# Patient Record
Sex: Female | Born: 1965 | Race: White | Hispanic: No | Marital: Married | Smoking: Never smoker
Health system: Southern US, Community
[De-identification: ages and names within clinical notes are randomized; demographics above are authoritative.]

## PROBLEM LIST (undated history)

## (undated) HISTORY — PX: NOSE SURGERY: SHX723

## (undated) HISTORY — PX: TUBAL LIGATION: SHX77

---

## 2008-05-07 DIAGNOSIS — C4491 Basal cell carcinoma of skin, unspecified: Secondary | ICD-10-CM

## 2008-05-07 DIAGNOSIS — D239 Other benign neoplasm of skin, unspecified: Secondary | ICD-10-CM

## 2008-05-07 HISTORY — DX: Other benign neoplasm of skin, unspecified: D23.9

## 2008-05-07 HISTORY — DX: Basal cell carcinoma of skin, unspecified: C44.91

## 2008-06-25 DIAGNOSIS — D239 Other benign neoplasm of skin, unspecified: Secondary | ICD-10-CM

## 2008-06-25 HISTORY — DX: Other benign neoplasm of skin, unspecified: D23.9

## 2008-10-06 ENCOUNTER — Encounter: Payer: Self-pay | Admitting: Maternal & Fetal Medicine

## 2009-03-27 ENCOUNTER — Ambulatory Visit: Payer: Self-pay | Admitting: Obstetrics and Gynecology

## 2009-03-30 ENCOUNTER — Inpatient Hospital Stay: Payer: Self-pay | Admitting: Obstetrics and Gynecology

## 2010-07-08 ENCOUNTER — Ambulatory Visit: Payer: Self-pay | Admitting: Family Medicine

## 2013-06-11 DIAGNOSIS — L570 Actinic keratosis: Secondary | ICD-10-CM

## 2013-06-11 HISTORY — DX: Actinic keratosis: L57.0

## 2018-01-16 DIAGNOSIS — M25561 Pain in right knee: Secondary | ICD-10-CM | POA: Diagnosis not present

## 2018-10-11 ENCOUNTER — Other Ambulatory Visit: Payer: Self-pay

## 2018-10-11 ENCOUNTER — Emergency Department: Payer: 59

## 2018-10-11 ENCOUNTER — Encounter: Payer: Self-pay | Admitting: Emergency Medicine

## 2018-10-11 ENCOUNTER — Emergency Department
Admission: EM | Admit: 2018-10-11 | Discharge: 2018-10-11 | Disposition: A | Discharge status: Home / Self Care | Payer: 59 | Attending: Emergency Medicine | Admitting: Emergency Medicine

## 2018-10-11 DIAGNOSIS — I4510 Unspecified right bundle-branch block: Secondary | ICD-10-CM | POA: Diagnosis not present

## 2018-10-11 DIAGNOSIS — R079 Chest pain, unspecified: Secondary | ICD-10-CM | POA: Diagnosis not present

## 2018-10-11 DIAGNOSIS — R0789 Other chest pain: Secondary | ICD-10-CM | POA: Diagnosis not present

## 2018-10-11 DIAGNOSIS — R55 Syncope and collapse: Secondary | ICD-10-CM | POA: Diagnosis not present

## 2018-10-11 LAB — BASIC METABOLIC PANEL
Anion gap: 8 (ref 5–15)
BUN: 12 mg/dL (ref 6–20)
CO2: 28 mmol/L (ref 22–32)
Calcium: 9.5 mg/dL (ref 8.9–10.3)
Chloride: 104 mmol/L (ref 98–111)
Creatinine, Ser: 0.88 mg/dL (ref 0.44–1.00)
GFR calc Af Amer: >60 mL/min (ref >60)
GFR calc non Af Amer: >60 mL/min (ref >60)
Glucose, Bld: 116 mg/dL — ABNORMAL HIGH (ref 70–99)
Potassium: 4.2 mmol/L (ref 3.5–5.1)
Sodium: 140 mmol/L (ref 135–145)

## 2018-10-11 LAB — CBC
HCT: 43 % (ref 36.0–46.0)
Hemoglobin: 13.9 g/dL (ref 12.0–15.0)
MCH: 29.6 pg (ref 26.0–34.0)
MCHC: 32.3 g/dL (ref 30.0–36.0)
MCV: 91.7 fL (ref 80.0–100.0)
Platelets: 248 10*3/uL (ref 150–400)
RBC: 4.69 MIL/uL (ref 3.87–5.11)
RDW: 12.4 % (ref 11.5–15.5)
WBC: 5.9 10*3/uL (ref 4.0–10.5)
nRBC: 0 % (ref 0.0–0.2)

## 2018-10-11 LAB — POCT PREGNANCY, URINE: Preg Test, Ur: NEGATIVE

## 2018-10-11 LAB — TROPONIN I
Troponin I: <0.03 ng/mL (ref <0.03)
Troponin I: <0.03 ng/mL (ref <0.03)

## 2018-10-11 NOTE — ED Notes (Signed)
Updated on wait. 

## 2018-10-11 NOTE — ED Provider Notes (Signed)
[facility] Emergency Department Provider Note ____________________________________________   First MD Initiated Contact with Patient 10/11/18 1530     (approximate)  I have reviewed the triage vital signs and the nursing notes.   HISTORY  Chief Complaint Chest Pain    HPI Selena Manning is a 52 y.o. female with PMH as noted below but no active medical problems and not currently on any medications who presents with atypical chest discomfort, acute onset this morning, described as tightness, and occurring while she was driving.  The patient states that she started to feel lightheaded and weak and had to pull over.  She was not exerting herself.  The symptoms subsided within an hour and she is now asymptomatic.  She reports having some of the tightness in the chest intermittently over the last few weeks, also nonexertional, but not the lightheadedness or other symptoms she had today.  The patient denies any shortness of breath, leg swelling, or other acute symptoms.  History reviewed. No pertinent past medical history.  There are no active problems to display for this patient.   Past Surgical History:  Procedure Laterality Date  . CESAREAN SECTION    . NOSE SURGERY    . TUBAL LIGATION      Prior to Admission medications   Not on File    Allergies Patient has no known allergies.  No family history on file.  Social History Social History   Tobacco Use  . Smoking status: Never Smoker  . Smokeless tobacco: Never Used  Substance Use Topics  . Alcohol use: Not on file  . Drug use: Not on file    Review of Systems  Constitutional: No fever. Eyes: No redness. ENT: No sore throat. Cardiovascular: Positive for resolved chest discomfort. Respiratory: Denies shortness of breath. Gastrointestinal: No vomiting or diarrhea.  Genitourinary: Negative for flank pain.  Musculoskeletal: Negative for back pain. Skin: Negative for rash. Neurological:  Negative for headache.   ____________________________________________   PHYSICAL EXAM:  VITAL SIGNS: ED Triage Vitals  Enc Vitals Group     BP 10/11/18 1200 (!) 150/72     Pulse Rate 10/11/18 1200 61     Resp 10/11/18 1200 16     Temp 10/11/18 1200 98.3 F (36.8 C)     Temp Source 10/11/18 1200 Oral     SpO2 10/11/18 1200 100 %     Weight 10/11/18 1200 150 lb (68 kg)     Height 10/11/18 1200 5\' 7"  (1.702 m)     Head Circumference --      Peak Flow --      Pain Score 10/11/18 1159 4     Pain Loc --      Pain Edu? --      Excl. in [place]? --     Constitutional: Alert and oriented. Well appearing and in no acute distress. Eyes: Conjunctivae are normal.  Head: Atraumatic. Nose: No congestion/rhinnorhea. Mouth/Throat: Mucous membranes are moist.   Neck: Normal range of motion.  Cardiovascular: Normal rate, regular rhythm. Grossly normal heart sounds.  Good peripheral circulation. Respiratory: Normal respiratory effort.  No retractions. Lungs CTAB. Gastrointestinal: No distention.  Musculoskeletal: Extremities warm and well perfused.  Neurologic:  Normal speech and language. No gross focal neurologic deficits are appreciated.  Skin:  Skin is warm and dry. No rash noted. Psychiatric: Mood and affect are normal. Speech and behavior are normal.  ____________________________________________   LABS (all labs ordered are listed, but only abnormal results are displayed)  Labs Reviewed  BASIC METABOLIC PANEL - Abnormal; Notable for the following components:      Result Value   Glucose, Bld 116 (*)    All other components within normal limits  CBC  TROPONIN I  TROPONIN I  POCT PREGNANCY, URINE  POC URINE PREG, ED   ____________________________________________  EKG  ED ECG REPORT I, Arta Silence, the attending physician, personally viewed and interpreted this ECG.  Date: 10/11/2018 EKG Time: 1200 Rate: 68 Rhythm: normal sinus rhythm QRS Axis: normal Intervals:  Incomplete RBBB ST/T Wave abnormalities: Nonspecific inferior and lateral T wave abnormality Narrative Interpretation: no evidence of acute ischemia; no prior EKG available for comparison  ____________________________________________  RADIOLOGY  CXR: No focal infiltrate or other acute abnormalities  ____________________________________________   PROCEDURES  Procedure(s) performed: No  Procedures  Critical Care performed: No ____________________________________________   INITIAL IMPRESSION / ASSESSMENT AND PLAN / ED COURSE  Pertinent labs & imaging results that were available during my care of the patient were reviewed by me and considered in my medical decision making (see chart for details).  52 year old female with no cardiac history, no active medical problems and not currently on any medications presents with atypical chest discomfort associated with lightheadedness this afternoon.  She has had a few episodes of mild chest discomfort over the last 2 weeks but without lightheadedness.  On exam the patient is very well-appearing.  She is currently asymptomatic.  Her vital signs are normal except for hypertension.  The remainder of her exam is unremarkable.  EKG shows no significant abnormalities (I do not have an old EKG for comparison).  Overall the presentation is most consistent with vasovagal near syncope.  The chest pain is likely musculoskeletal as I was somewhat able to reproduce it on exam.  The patient is low risk for ACS.  We will obtain a repeat troponin now that it has been more than 4 hours since the onset of the symptoms.  There is no clinical evidence for PE, aortic dissection, or other concerning acute cause given that the symptoms have resolved.  I anticipate discharge home if the repeat troponin is negative.  ----------------------------------------- 4:38 PM on 10/11/2018 -----------------------------------------  Repeat troponin is negative.  The patient  remains asymptomatic.  She is stable for discharge home at this time.  I counseled her on the results of the work-up.  Return precautions given, and she expresses understanding. ____________________________________________   FINAL CLINICAL IMPRESSION(S) / ED DIAGNOSES  Final diagnoses:  Atypical chest pain  Near syncope      NEW MEDICATIONS STARTED DURING THIS VISIT:  New Prescriptions   No medications on file     Note:  This document was prepared using Dragon voice recognition software and may include unintentional dictation errors.   Arta Silence, MD 10/11/18 [phone]

## 2018-10-11 NOTE — ED Notes (Signed)
Pt states she had CP this morning with L jaw/arm numbness. Currently no pain unless she lifts her arm.

## 2018-10-11 NOTE — Discharge Instructions (Addendum)
Return to the ER for new, worsening, or persistent severe chest pain or discomfort, weakness or lightheadedness, difficulty breathing, or any other new or worsening symptoms that concern you.  Follow-up with your primary care doctor within the next few weeks as discussed.

## 2018-10-11 NOTE — ED Notes (Signed)
Pt/husband educated on d/c instructions. IV removed. Ambulatory upon d/c.

## 2018-10-11 NOTE — ED Triage Notes (Signed)
C/O tightness to left chest, left arm pain, dizziness.  Onset of symptoms about 2 weeks ago.  Has scheduled an appointment with PCP scheduled.  Symptoms have been intermittent, but for past hour symptoms have been constant.

## 2018-11-12 DIAGNOSIS — R030 Elevated blood-pressure reading, without diagnosis of hypertension: Secondary | ICD-10-CM | POA: Diagnosis not present

## 2018-11-12 DIAGNOSIS — R635 Abnormal weight gain: Secondary | ICD-10-CM | POA: Diagnosis not present

## 2018-11-12 DIAGNOSIS — R739 Hyperglycemia, unspecified: Secondary | ICD-10-CM | POA: Diagnosis not present

## 2018-11-27 DIAGNOSIS — Z1211 Encounter for screening for malignant neoplasm of colon: Secondary | ICD-10-CM | POA: Diagnosis not present

## 2018-11-27 DIAGNOSIS — Z0000 Encounter for general adult medical examination without abnormal findings: Secondary | ICD-10-CM | POA: Diagnosis not present

## 2018-11-27 DIAGNOSIS — R739 Hyperglycemia, unspecified: Secondary | ICD-10-CM | POA: Diagnosis not present

## 2018-11-27 DIAGNOSIS — Z01818 Encounter for other preprocedural examination: Secondary | ICD-10-CM | POA: Diagnosis not present

## 2018-11-28 DIAGNOSIS — R8290 Unspecified abnormal findings in urine: Secondary | ICD-10-CM | POA: Diagnosis not present

## 2020-12-20 ENCOUNTER — Emergency Department (HOSPITAL_BASED_OUTPATIENT_CLINIC_OR_DEPARTMENT_OTHER): Payer: 59

## 2020-12-20 ENCOUNTER — Other Ambulatory Visit: Payer: Self-pay

## 2020-12-20 ENCOUNTER — Encounter (HOSPITAL_BASED_OUTPATIENT_CLINIC_OR_DEPARTMENT_OTHER): Payer: Self-pay | Admitting: Emergency Medicine

## 2020-12-20 ENCOUNTER — Emergency Department (HOSPITAL_BASED_OUTPATIENT_CLINIC_OR_DEPARTMENT_OTHER)
Admission: EM | Admit: 2020-12-20 | Discharge: 2020-12-20 | Disposition: A | Discharge status: Home / Self Care | Payer: 59 | Attending: Emergency Medicine | Admitting: Emergency Medicine

## 2020-12-20 DIAGNOSIS — W01198A Fall on same level from slipping, tripping and stumbling with subsequent striking against other object, initial encounter: Secondary | ICD-10-CM | POA: Insufficient documentation

## 2020-12-20 DIAGNOSIS — S59901A Unspecified injury of right elbow, initial encounter: Secondary | ICD-10-CM | POA: Diagnosis not present

## 2020-12-20 DIAGNOSIS — Y9389 Activity, other specified: Secondary | ICD-10-CM | POA: Insufficient documentation

## 2020-12-20 DIAGNOSIS — S0003XA Contusion of scalp, initial encounter: Secondary | ICD-10-CM | POA: Insufficient documentation

## 2020-12-20 DIAGNOSIS — S0990XA Unspecified injury of head, initial encounter: Secondary | ICD-10-CM | POA: Diagnosis present

## 2020-12-20 DIAGNOSIS — S53409A Unspecified sprain of unspecified elbow, initial encounter: Secondary | ICD-10-CM

## 2020-12-20 NOTE — ED Triage Notes (Signed)
Reports she was cleaning a light and when she went to step down she stepped down wrong causing her to fall hitting the back of her head.  Also c/o pain to right elbow.

## 2020-12-20 NOTE — Discharge Instructions (Signed)
Take tylenol, motrin for headaches   See your doctor for follow up   Your CT scan today didn't show any fractures or bleeding   Return to ER if you have worse headaches, neck pain, vomiting, weakness, numbness

## 2020-12-20 NOTE — ED Provider Notes (Signed)
[facility] Provider Note   CSN: [id number] Arrival date & time: 12/20/20  1756     History Chief Complaint  Patient presents with  . Head Injury    Selena Manning is a 55 y.o. female here with head injury, R elbow injury. Patient was cleaning a light and was about up on a 4 foot ladder. Patient slipped and fell backward and hit her head and right elbow. Denies syncope or LOC. No meds prior to arrival.   The history is provided by the patient.       History reviewed. No pertinent past medical history.  There are no problems to display for this patient.   Past Surgical History:  Procedure Laterality Date  . CESAREAN SECTION    . NOSE SURGERY    . TUBAL LIGATION       OB History   No obstetric history on file.     No family history on file.  Social History   Tobacco Use  . Smoking status: Never Smoker  . Smokeless tobacco: Never Used  Substance Use Topics  . Alcohol use: Yes  . Drug use: Never    Home Medications Prior to Admission medications   Not on File    Allergies    Patient has no known allergies.  Review of Systems   Review of Systems  Musculoskeletal:       R elbow pain   Neurological: Positive for headaches.  All other systems reviewed and are negative.   Physical Exam Updated Vital Signs BP (!) 117/95 (BP Location: Right Arm)   Pulse 61   Temp 98.3 F (36.8 C) (Oral)   Resp 20   Ht 5\' 7"  (1.702 m)   Wt 75.2 kg   SpO2 98%   BMI 25.97 kg/m   Physical Exam Vitals and nursing note reviewed.  Constitutional:      Appearance: Normal appearance.  HENT:     Head:     Comments: Small posterior scalp hematoma, no laceration     Nose: Nose normal.     Mouth/Throat:     Mouth: Mucous membranes are moist.  Eyes:     Extraocular Movements: Extraocular movements intact.     Pupils: Pupils are equal, round, and reactive to light.  Cardiovascular:     Rate and Rhythm: Normal rate and regular rhythm.      Pulses: Normal pulses.     Heart sounds: Normal heart sounds.  Pulmonary:     Effort: Pulmonary effort is normal.     Breath sounds: Normal breath sounds.  Abdominal:     General: Abdomen is flat.     Palpations: Abdomen is soft.  Musculoskeletal:     Cervical back: Normal range of motion and neck supple.     Comments: Mild R elbow tenderness, no obvious hematoma or skin tear. No midline spinal tenderness   Skin:    General: Skin is warm.     Capillary Refill: Capillary refill takes less than 2 seconds.  Neurological:     General: No focal deficit present.     Mental Status: She is alert and oriented to person, place, and time.     Cranial Nerves: No cranial nerve deficit.     Sensory: No sensory deficit.     Motor: No weakness.     Coordination: Coordination normal.  Psychiatric:        Mood and Affect: Mood normal.        Behavior: Behavior  normal.     ED Results / Procedures / Treatments   Labs (all labs ordered are listed, but only abnormal results are displayed) Labs Reviewed - No data to display  EKG None  Radiology DG Elbow Complete Right  Result Date: 12/20/2020 CLINICAL DATA:  Post fall with right elbow pain. EXAM: RIGHT ELBOW - COMPLETE 3+ VIEW COMPARISON:  None. FINDINGS: There is no evidence of fracture, dislocation, or joint effusion. There is no evidence of arthropathy or other focal bone abnormality. Soft tissues are unremarkable. IMPRESSION: Negative radiographs of the right elbow. Electronically Signed   By: Keith Rake M.D.   On: 12/20/2020 19:09   CT Head Wo Contrast  Result Date: 12/20/2020 CLINICAL DATA:  Head trauma, mod-severe fall Fall striking back of head. EXAM: CT HEAD WITHOUT CONTRAST TECHNIQUE: Contiguous axial images were obtained from the base of the skull through the vertex without intravenous contrast. COMPARISON:  None. FINDINGS: Brain: No intracranial hemorrhage, mass effect, or midline shift. No hydrocephalus. The basilar cisterns are  patent. Perivascular space versus remote lacunar infarct in the right basal ganglia. No evidence of territorial infarct or acute ischemia. No extra-axial or intracranial fluid collection. Vascular: No hyperdense vessel or unexpected calcification. Skull: No fracture or focal lesion. Sinuses/Orbits: Paranasal sinuses and mastoid air cells are clear. The visualized orbits are unremarkable. Other: Left parietal scalp hematoma. IMPRESSION: Left parietal scalp hematoma. No acute intracranial abnormality. No skull fracture. Electronically Signed   By: Keith Rake M.D.   On: 12/20/2020 19:08    Procedures Procedures   Medications Ordered in ED Medications - No data to display  ED Course  I have reviewed the triage vital signs and the nursing notes.  Pertinent labs & imaging results that were available during my care of the patient were reviewed by me and considered in my medical decision making (see chart for details).    MDM Rules/Calculators/A&P                         Gaylynn Seiple is a 55 y.o. female here with fall. Patient has posterior scalp hematoma. CT head unremarkable. Also right elbow injury but xrays unremarkable. Likely contusion. Not on blood thinners. Nonfocal neuro exam. Will dc home.    Final Clinical Impression(s) / ED Diagnoses Final diagnoses:  None    Rx / DC Orders ED Discharge Orders    None       Drenda Freeze, MD 12/20/20 2037

## 2021-01-05 ENCOUNTER — Other Ambulatory Visit (HOSPITAL_COMMUNITY): Payer: Self-pay | Admitting: Otolaryngology

## 2021-01-06 ENCOUNTER — Other Ambulatory Visit: Payer: Self-pay | Admitting: Family Medicine

## 2021-01-06 DIAGNOSIS — N950 Postmenopausal bleeding: Secondary | ICD-10-CM

## 2021-01-12 ENCOUNTER — Ambulatory Visit (HOSPITAL_COMMUNITY)
Admission: RE | Admit: 2021-01-12 | Discharge: 2021-01-12 | Disposition: A | Discharge status: Home / Self Care | Payer: 59 | Source: Ambulatory Visit | Attending: Family Medicine | Admitting: Family Medicine

## 2021-01-12 ENCOUNTER — Other Ambulatory Visit: Payer: Self-pay

## 2021-01-12 ENCOUNTER — Other Ambulatory Visit: Payer: Self-pay | Admitting: Family Medicine

## 2021-01-12 DIAGNOSIS — N950 Postmenopausal bleeding: Secondary | ICD-10-CM | POA: Insufficient documentation

## 2021-01-12 DIAGNOSIS — Z1231 Encounter for screening mammogram for malignant neoplasm of breast: Secondary | ICD-10-CM

## 2021-01-28 ENCOUNTER — Other Ambulatory Visit: Payer: Self-pay

## 2021-01-28 ENCOUNTER — Ambulatory Visit
Admission: RE | Admit: 2021-01-28 | Discharge: 2021-01-28 | Disposition: A | Discharge status: Home / Self Care | Payer: 59 | Source: Ambulatory Visit | Attending: Family Medicine | Admitting: Family Medicine

## 2021-01-28 DIAGNOSIS — Z1231 Encounter for screening mammogram for malignant neoplasm of breast: Secondary | ICD-10-CM | POA: Diagnosis not present

## 2021-10-21 ENCOUNTER — Encounter: Payer: Self-pay | Admitting: Dermatology

## 2021-10-25 ENCOUNTER — Ambulatory Visit: Payer: 59 | Admitting: Dermatology

## 2021-10-25 ENCOUNTER — Other Ambulatory Visit: Payer: Self-pay

## 2021-10-25 DIAGNOSIS — L570 Actinic keratosis: Secondary | ICD-10-CM

## 2021-10-25 DIAGNOSIS — D1800 Hemangioma unspecified site: Secondary | ICD-10-CM

## 2021-10-25 DIAGNOSIS — D229 Melanocytic nevi, unspecified: Secondary | ICD-10-CM

## 2021-10-25 DIAGNOSIS — Z86018 Personal history of other benign neoplasm: Secondary | ICD-10-CM

## 2021-10-25 DIAGNOSIS — L820 Inflamed seborrheic keratosis: Secondary | ICD-10-CM | POA: Diagnosis not present

## 2021-10-25 DIAGNOSIS — L578 Other skin changes due to chronic exposure to nonionizing radiation: Secondary | ICD-10-CM

## 2021-10-25 DIAGNOSIS — Z85828 Personal history of other malignant neoplasm of skin: Secondary | ICD-10-CM

## 2021-10-25 DIAGNOSIS — D485 Neoplasm of uncertain behavior of skin: Secondary | ICD-10-CM

## 2021-10-25 DIAGNOSIS — C4491 Basal cell carcinoma of skin, unspecified: Secondary | ICD-10-CM

## 2021-10-25 DIAGNOSIS — Z1283 Encounter for screening for malignant neoplasm of skin: Secondary | ICD-10-CM | POA: Diagnosis not present

## 2021-10-25 DIAGNOSIS — L821 Other seborrheic keratosis: Secondary | ICD-10-CM

## 2021-10-25 DIAGNOSIS — C44519 Basal cell carcinoma of skin of other part of trunk: Secondary | ICD-10-CM | POA: Diagnosis not present

## 2021-10-25 DIAGNOSIS — D489 Neoplasm of uncertain behavior, unspecified: Secondary | ICD-10-CM

## 2021-10-25 DIAGNOSIS — L814 Other melanin hyperpigmentation: Secondary | ICD-10-CM

## 2021-10-25 HISTORY — DX: Actinic keratosis: L57.0

## 2021-10-25 HISTORY — DX: Basal cell carcinoma of skin, unspecified: C44.91

## 2021-10-25 NOTE — Progress Notes (Signed)
New Patient Visit  Subjective  Selena Manning is a 56 y.o. 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 Patient reports most recent 2020. Patient does have several spots she would like checked back, chest, area at face would like checked. Patient noticed 5 months or more. ). The patient presents for Total-Body Skin Exam (TBSE) for skin cancer screening and mole check.  The patient has spots, moles and lesions to be evaluated, some may be new or changing and the patient has concerns that these could be cancer.  The following portions of the chart were reviewed this encounter and updated as appropriate:   Tobacco   Allergies   Meds   Problems   Med Hx   Surg Hx   Fam Hx      Review of Systems:  No other skin or systemic complaints except as noted in HPI or Assessment and Plan.  Objective  Well appearing patient in no apparent distress; mood and affect are within normal limits.  A full examination was performed including scalp, head, eyes, ears, nose, lips, neck, chest, axillae, abdomen, back, buttocks, bilateral upper extremities, bilateral lower extremities, hands, feet, fingers, toes, fingernails, and toenails. All findings within normal limits unless otherwise noted below.  right cheek x 2, forehead x 4 (6) Erythematous thin papules/macules with gritty scale.   right cheek x 2 (2) Erythematous stuck-on, waxy papule or plaque  left mid to lower back paraspinal 1 cm crusted pink papule        Left Breast 0.6 cm red papule         Assessment & Plan  Actinic keratosis (6) right cheek x 2, forehead x 4  Actinic keratoses are precancerous spots that appear secondary to cumulative UV radiation exposure/sun exposure over time. They are chronic with expected duration over 1 year. A portion of actinic keratoses will progress to squamous cell carcinoma of the skin. It is not possible  to reliably predict which spots will progress to skin cancer and so treatment is recommended to prevent development of skin cancer.  Recommend daily broad spectrum sunscreen SPF 30+ to sun-exposed areas, reapply every 2 hours as needed.  Recommend staying in the shade or wearing long sleeves, sun glasses (UVA+UVB protection) and wide brim hats (4-inch brim around the entire circumference of the hat). Call for new or changing lesions.  Destruction of lesion - right cheek x 2, forehead x 4 Complexity: simple   Destruction method: cryotherapy   Informed consent: discussed and consent obtained   Timeout:  patient name, date of birth, surgical site, and procedure verified Lesion destroyed using liquid nitrogen: Yes   Region frozen until ice ball extended beyond lesion: Yes   Outcome: patient tolerated procedure well with no complications   Post-procedure details: wound care instructions given   Additional details:  Prior to procedure, discussed risks of blister formation, small wound, skin dyspigmentation, or rare scar following cryotherapy. Recommend Vaseline ointment to treated areas while healing.   Inflamed seborrheic keratosis (2) right cheek x 2  Destruction of lesion - right cheek x 2 Complexity: simple   Destruction method: cryotherapy   Informed consent: discussed and consent obtained   Timeout:  patient name, date of birth, surgical site, and procedure verified Lesion destroyed using liquid nitrogen: Yes   Region frozen until ice ball extended beyond lesion: Yes   Outcome: patient tolerated procedure well with no complications  Post-procedure details: wound care instructions given   Additional details:  Prior to procedure, discussed risks of blister formation, small wound, skin dyspigmentation, or rare scar following cryotherapy. Recommend Vaseline ointment to treated areas while healing.   Neoplasm of uncertain behavior (2) left mid to lower back paraspinal  Epidermal /  dermal shaving  Lesion diameter (cm):  1 Informed consent: discussed and consent obtained   Timeout: patient name, date of birth, surgical site, and procedure verified   Procedure prep:  Patient was prepped and draped in usual sterile fashion Prep type:  Isopropyl alcohol Anesthesia: the lesion was anesthetized in a standard fashion   Anesthetic:  1% lidocaine w/ epinephrine 1-100,000 buffered w/ 8.4% NaHCO3 Instrument used: flexible razor blade   Hemostasis achieved with: pressure, aluminum chloride and electrodesiccation   Outcome: patient tolerated procedure well   Post-procedure details: sterile dressing applied and wound care instructions given   Dressing type: bandage and petrolatum    Destruction of lesion Complexity: extensive   Destruction method: electrodesiccation and curettage   Informed consent: discussed and consent obtained   Timeout:  patient name, date of birth, surgical site, and procedure verified Procedure prep:  Patient was prepped and draped in usual sterile fashion Prep type:  Isopropyl alcohol Anesthesia: the lesion was anesthetized in a standard fashion   Anesthetic:  1% lidocaine w/ epinephrine 1-100,000 buffered w/ 8.4% NaHCO3 Curettage performed in three different directions: Yes   Electrodesiccation performed over the curetted area: Yes   Curettage cycles:  4 Lesion length (cm):  1 Lesion width (cm):  1 Margin per side (cm):  0.2 Final wound size (cm):  1.4 Hemostasis achieved with:  pressure, aluminum chloride and electrodesiccation Outcome: patient tolerated procedure well with no complications   Post-procedure details: sterile dressing applied and wound care instructions given   Dressing type: bandage and petrolatum    Specimen 1 - Surgical pathology Differential Diagnosis: r/o inflamed seborrheic keratosis vs bcc  Check Margins: No  Left Breast  Skin / nail biopsy Type of biopsy: tangential   Informed consent: discussed and consent  obtained   Timeout: patient name, date of birth, surgical site, and procedure verified   Procedure prep:  Patient was prepped and draped in usual sterile fashion Prep type:  Isopropyl alcohol Anesthesia: the lesion was anesthetized in a standard fashion   Anesthetic:  1% lidocaine w/ epinephrine 1-100,000 buffered w/ 8.4% NaHCO3 Instrument used: flexible razor blade   Hemostasis achieved with: pressure, aluminum chloride and electrodesiccation   Outcome: patient tolerated procedure well   Post-procedure details: sterile dressing applied and wound care instructions given   Dressing type: petrolatum and bandage    Specimen 2 - Surgical pathology Differential Diagnosis: r/o bcc   Check Margins: No  Skin cancer screening  Lentigines - Scattered tan macules - Due to sun exposure - Benign-appearing, observe - Recommend daily broad spectrum sunscreen SPF 30+ to sun-exposed areas, reapply every 2 hours as needed. - Call for any changes  Seborrheic Keratoses - Stuck-on, waxy, tan-brown papules and/or plaques  - Benign-appearing - Discussed benign etiology and prognosis. - Observe - Call for any changes  Melanocytic Nevi - Tan-brown and/or pink-flesh-colored symmetric macules and papules - Benign appearing on exam today - Observation - Call clinic for new or changing moles - Recommend daily use of broad spectrum spf 30+ sunscreen to sun-exposed areas.   Hemangiomas - Red papules - Discussed benign nature - Observe - Call for any changes  Actinic Damage - Chronic condition,  secondary to cumulative UV/sun exposure - diffuse scaly erythematous macules with underlying dyspigmentation - Recommend daily broad spectrum sunscreen SPF 30+ to sun-exposed areas, reapply every 2 hours as needed.  - Staying in the shade or wearing long sleeves, sun glasses (UVA+UVB protection) and wide brim hats (4-inch brim around the entire circumference of the hat) are also recommended for sun  protection.  - Call for new or changing lesions.  Skin cancer screening performed today.  Return for 4 - 6 month tbse h/o bcc .  IRuthell Rummage, CMA, am acting as scribe for Sarina Ser, MD. Documentation: I have reviewed the above documentation for accuracy and completeness, and I agree with the above.  Sarina Ser, MD

## 2021-10-25 NOTE — Patient Instructions (Addendum)
Biopsy Wound Care Instructions  Leave the original bandage on for 24 hours if possible.  If the bandage becomes soaked or soiled before that time, it is OK to remove it and examine the wound.  A small amount of post-operative bleeding is normal.  If excessive bleeding occurs, remove the bandage, place gauze over the site and apply continuous pressure (no peeking) over the area for 30 minutes. If this does not work, please call our clinic as soon as possible or page your doctor if it is after hours.   Once a day, cleanse the wound with soap and water. It is fine to shower. If a thick crust develops you may use a Q-tip dipped into dilute hydrogen peroxide (mix 1:1 with water) to dissolve it.  Hydrogen peroxide can slow the healing process, so use it only as needed.    After washing, apply petroleum jelly (Vaseline) or an antibiotic ointment if your doctor prescribed one for you, followed by a bandage.    For best healing, the wound should be covered with a layer of ointment at all times. If you are not able to keep the area covered with a bandage to hold the ointment in place, this may mean re-applying the ointment several times a day.  Continue this wound care until the wound has healed and is no longer open.   Itching and mild discomfort is normal during the healing process. However, if you develop pain or severe itching, please call our office.   If you have any discomfort, you can take Tylenol (acetaminophen) or ibuprofen as directed on the bottle. (Please do not take these if you have an allergy to them or cannot take them for another reason).  Some redness, tenderness and white or yellow material in the wound is normal healing.  If the area becomes very sore and red, or develops a thick yellow-green material (pus), it may be infected; please notify us.    If you have stitches, return to clinic as directed to have the stitches removed. You will continue wound care for 2-3 days after the stitches  are removed.   Wound healing continues for up to one year following surgery. It is not unusual to experience pain in the scar from time to time during the interval.  If the pain becomes severe or the scar thickens, you should notify the office.    A slight amount of redness in a scar is expected for the first six months.  After six months, the redness will fade and the scar will soften and fade.  The color difference becomes less noticeable with time.  If there are any problems, return for a post-op surgery check at your earliest convenience.  To improve the appearance of the scar, you can use silicone scar gel, cream, or sheets (such as Mederma or Serica) every night for up to one year. These are available over the counter (without a prescription).  Please call our office at [phone] for any questions or concerns.   Electrodesiccation and Curettage (Scrape and Burn) Wound Care Instructions  Leave the original bandage on for 24 hours if possible.  If the bandage becomes soaked or soiled before that time, it is OK to remove it and examine the wound.  A small amount of post-operative bleeding is normal.  If excessive bleeding occurs, remove the bandage, place gauze over the site and apply continuous pressure (no peeking) over the area for 30 minutes. If this does not work, please call our  clinic as soon as possible or page your doctor if it is after hours.   Once a day, cleanse the wound with soap and water. It is fine to shower. If a thick crust develops you may use a Q-tip dipped into dilute hydrogen peroxide (mix 1:1 with water) to dissolve it.  Hydrogen peroxide can slow the healing process, so use it only as needed.    After washing, apply petroleum jelly (Vaseline) or an antibiotic ointment if your doctor prescribed one for you, followed by a bandage.    For best healing, the wound should be covered with a layer of ointment at all times. If you are not able to keep the area covered with  a bandage to hold the ointment in place, this may mean re-applying the ointment several times a day.  Continue this wound care until the wound has healed and is no longer open. It may take several weeks for the wound to heal and close.  Itching and mild discomfort is normal during the healing process.  If you have any discomfort, you can take Tylenol (acetaminophen) or ibuprofen as directed on the bottle. (Please do not take these if you have an allergy to them or cannot take them for another reason).  Some redness, tenderness and white or yellow material in the wound is normal healing.  If the area becomes very sore and red, or develops a thick yellow-green material (pus), it may be infected; please notify us.    Wound healing continues for up to one year following surgery. It is not unusual to experience pain in the scar from time to time during the interval.  If the pain becomes severe or the scar thickens, you should notify the office.    A slight amount of redness in a scar is expected for the first six months.  After six months, the redness will fade and the scar will soften and fade.  The color difference becomes less noticeable with time.  If there are any problems, return for a post-op surgery check at your earliest convenience.  To improve the appearance of the scar, you can use silicone scar gel, cream, or sheets (such as Mederma or Serica) every night for up to one year. These are available over the counter (without a prescription).  Please call our office at [phone] for any questions or concerns.    Melanoma ABCDEs  Melanoma is the most dangerous type of skin cancer, and is the leading cause of death from skin disease.  You are more likely to develop melanoma if you: Have light-colored skin, light-colored eyes, or red or blond hair Spend a lot of time in the sun Tan regularly, either outdoors or in a tanning bed Have had blistering sunburns, especially during  childhood Have a close family member who has had a melanoma Have atypical moles or large birthmarks  Early detection of melanoma is key since treatment is typically straightforward and cure rates are extremely high if we catch it early.   The first sign of melanoma is often a change in a mole or a new dark spot.  The ABCDE system is a way of remembering the signs of melanoma.  A for asymmetry:  The two halves do not match. B for border:  The edges of the growth are irregular. C for color:  A mixture of colors are present instead of an even brown color. D for diameter:  Melanomas are usually (but not always) greater than 8mm - the  size of a pencil eraser. E for evolution:  The spot keeps changing in size, shape, and color.  Please check your skin once per month between visits. You can use a small mirror in front and a large mirror behind you to keep an eye on the back side or your body.   If you see any new or changing lesions before your next follow-up, please call to schedule a visit.  Please continue daily skin protection including broad spectrum sunscreen SPF 30+ to sun-exposed areas, reapplying every 2 hours as needed when you're outdoors.   Staying in the shade or wearing long sleeves, sun glasses (UVA+UVB protection) and wide brim hats (4-inch brim around the entire circumference of the hat) are also recommended for sun protection.    If You Need Anything After Your Visit  If you have any questions or concerns for your doctor, please call our main line at [phone] and press option 4 to reach your doctor's medical assistant. If no one answers, please leave a voicemail as directed and we will return your call as soon as possible. Messages left after 4 pm will be answered the following business day.   You may also send [place] a message via [place]. We typically respond to MyChart messages within 1-2 business days.  For prescription refills, please ask your pharmacy to contact our  office. Our fax number is [phone].  If you have an urgent issue when the clinic is closed that cannot wait until the next business day, you can page your doctor at the number below.    Please note that while we do our best to be available for urgent issues outside of office hours, we are not available 24/7.   If you have an urgent issue and are unable to reach [place], you may choose to seek medical care at your doctor's office, retail clinic, urgent care center, or emergency room.  If you have a medical emergency, please immediately call 911 or go to the emergency department.  Pager Numbers  - Dr. Nehemiah Massed: [phone]  - Dr. Laurence Ferrari: [phone]  - Dr. Nicole Kindred: [phone]  In the event of inclement weather, please call our main line at [phone] for an update on the status of any delays or closures.  Dermatology Medication Tips: Please keep the boxes that topical medications come in in order to help keep track of the instructions about where and how to use these. Pharmacies typically print the medication instructions only on the boxes and not directly on the medication tubes.   If your medication is too expensive, please contact our office at [phone] option 4 or send [place] a message through [place].   We are unable to tell what your co-pay for medications will be in advance as this is different depending on your insurance coverage. However, we may be able to find a substitute medication at lower cost or fill out paperwork to get insurance to cover a needed medication.   If a prior authorization is required to get your medication covered by your insurance company, please allow [place] 1-2 business days to complete this process.  Drug prices often vary depending on where the prescription is filled and some pharmacies may offer cheaper prices.  The website www.goodrx.com contains coupons for medications through different pharmacies. The prices here do not account for what the cost may  be with help from insurance (it may be cheaper with your insurance), but the website can give you the price if you did not use  any insurance.  - You can print the associated coupon and take it with your prescription to the pharmacy.  - You may also stop by our office during regular business hours and pick up a GoodRx coupon card.  - If you need your prescription sent electronically to a different pharmacy, notify our office through [facility] or by phone at [phone] option 4.     Si Usted Necesita Algo Despus de Su Visita  Tambin puede enviarnos un mensaje a travs de Pharmacist, community. Por lo general respondemos a los mensajes de MyChart en el transcurso de 1 a 2 das hbiles.  Para renovar recetas, por favor pida a su farmacia que se ponga en contacto con nuestra oficina. Harland Dingwall de fax es [place] [phone].  Si tiene un asunto urgente cuando la clnica est cerrada y que no puede esperar hasta el siguiente da hbil, puede llamar/localizar a su doctor(a) al nmero que aparece a continuacin.   Por favor, tenga en cuenta que aunque hacemos todo lo posible para estar disponibles para asuntos urgentes fuera del horario de [place], no estamos disponibles las 24 horas del da, los 7 das de la [place].   Si tiene un problema urgente y no puede comunicarse con nosotros, puede optar por buscar atencin mdica  en el consultorio de su doctor(a), en una clnica privada, en un centro de atencin urgente o en una sala de emergencias.  Si tiene Engineering geologist, por favor llame inmediatamente al 911 o vaya a la sala de emergencias.  Nmeros de bper  - Dr. Nehemiah Massed: [phone]  - Dra. Moye: [phone]  - Dra. Nicole Kindred: [phone]  En caso de inclemencias del [place], por favor llame a Johnsie Kindred principal al [phone] para una actualizacin sobre el [place] de cualquier retraso o cierre.  Consejos para la medicacin en dermatologa: Por favor, guarde las cajas en las  que vienen los medicamentos de uso tpico para ayudarle a seguir las instrucciones sobre dnde y cmo usarlos. Las farmacias generalmente imprimen las instrucciones del medicamento slo en las cajas y no directamente en los tubos del [place].   Si su medicamento es muy caro, por favor, pngase en contacto con Zigmund Daniel llamando al [phone] y presione la opcin 4 o envenos un mensaje a travs de Pharmacist, community.   No podemos decirle cul ser su copago por los medicamentos por adelantado ya que esto es diferente dependiendo de la cobertura de su seguro. Sin embargo, es posible que podamos encontrar un medicamento sustituto a Electrical engineer un formulario para que el seguro cubra el medicamento que se considera necesario.   Si se requiere una autorizacin previa para que su compaa de seguros [place] su medicamento, por favor permtanos de 1 a 2 das hbiles para completar este proceso.  Los precios de los medicamentos varan con frecuencia dependiendo del Environmental consultant de dnde se surte la receta y alguna farmacias pueden ofrecer precios ms baratos.  El sitio web www.goodrx.com tiene cupones para medicamentos de Airline pilot. Los precios aqu no tienen en cuenta lo que podra costar con la ayuda del seguro (puede ser ms barato con su seguro), pero el sitio web puede darle el precio si no utiliz Research scientist (physical sciences).  - Puede imprimir el cupn correspondiente y llevarlo con su receta a la farmacia.  - Tambin puede pasar por nuestra oficina durante el horario de atencin regular y Charity fundraiser una tarjeta de cupones de GoodRx.  - Si necesita que su receta se enve electrnicamente a Ardelia Mems  farmacia diferente, informe a nuestra oficina a travs de MyChart de [place] o por telfono llamando al [phone] y presione la opcin 4.

## 2021-10-26 ENCOUNTER — Encounter: Payer: Self-pay | Admitting: Dermatology

## 2021-10-28 ENCOUNTER — Telehealth: Payer: Self-pay

## 2021-10-28 NOTE — Telephone Encounter (Signed)
-----   Message from Ralene Bathe, MD sent at 10/26/2021  6:34 PM EST ----- Diagnosis 1. 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 Skin , left breast HYPERTROPHIC ACTINIC KERATOSIS  1- Cancer - BCC Already treated Recheck next visit 2- PreCancer Schedule for treatment (LN2 and possible topical treatment)

## 2021-10-28 NOTE — Telephone Encounter (Signed)
Advised pt of bx results.  Scheduled pt for f/u to txt AK./sh

## 2021-12-02 ENCOUNTER — Other Ambulatory Visit: Payer: Self-pay

## 2021-12-02 ENCOUNTER — Ambulatory Visit: Payer: 59 | Admitting: Dermatology

## 2021-12-02 DIAGNOSIS — L570 Actinic keratosis: Secondary | ICD-10-CM | POA: Diagnosis not present

## 2021-12-02 DIAGNOSIS — L578 Other skin changes due to chronic exposure to nonionizing radiation: Secondary | ICD-10-CM | POA: Diagnosis not present

## 2021-12-02 DIAGNOSIS — Z85828 Personal history of other malignant neoplasm of skin: Secondary | ICD-10-CM | POA: Diagnosis not present

## 2021-12-02 NOTE — Progress Notes (Signed)
° °  Follow-Up Visit   Subjective  Selena Manning is a 56 y.o. female who presents for the following: Actinic Keratosis (Biopsy proven Hypertrophic AK on the left breast 10/25/2021 pt here for treatment ). BCC treated on the Left mid to lower back paraspinal 10/25/2021  The following portions of the chart were reviewed this encounter and updated as appropriate:   Tobacco   Allergies   Meds   Problems   Med Hx   Surg Hx   Fam Hx      Review of Systems:  No other skin or systemic complaints except as noted in HPI or Assessment and Plan.  Objective  Well appearing patient in no apparent distress; mood and affect are within normal limits.  A focused examination was performed including chest,back . Relevant physical exam findings are noted in the Assessment and Plan.  Chest - Medial [facility]) Erythematous thin papules/macules with gritty scale.    Assessment & Plan  AK (actinic keratosis) Chest - Medial [facility])  Biopsy proven AK  Actinic keratoses are precancerous spots that appear secondary to cumulative UV radiation exposure/sun exposure over time. They are chronic with expected duration over 1 year. A portion of actinic keratoses will progress to squamous cell carcinoma of the skin. It is not possible to reliably predict which spots will progress to skin cancer and so treatment is recommended to prevent development of skin cancer.  Recommend daily broad spectrum sunscreen SPF 30+ to sun-exposed areas, reapply every 2 hours as needed.  Recommend staying in the shade or wearing long sleeves, sun glasses (UVA+UVB protection) and wide brim hats (4-inch brim around the entire circumference of the hat). Call for new or changing lesions.   Destruction of lesion - Chest - Medial (Center) Complexity: simple   Destruction method: cryotherapy   Informed consent: discussed and consent obtained   Timeout:  patient name, date of birth, surgical site, and procedure verified Lesion destroyed using  liquid nitrogen: Yes   Region frozen until ice ball extended beyond lesion: Yes   Outcome: patient tolerated procedure well with no complications   Post-procedure details: wound care instructions given    History of Basal Cell Carcinoma of the Skin Left mid to lower back paraspinal 10/25/2021 - No evidence of recurrence today - Recommend regular full body skin exams - Recommend daily broad spectrum sunscreen SPF 30+ to sun-exposed areas, reapply every 2 hours as needed.  - Call if any new or changing lesions are noted between office visits   Actinic Damage - chronic, secondary to cumulative UV radiation exposure/sun exposure over time - diffuse scaly erythematous macules with underlying dyspigmentation - Recommend daily broad spectrum sunscreen SPF 30+ to sun-exposed areas, reapply every 2 hours as needed.  - Recommend staying in the shade or wearing long sleeves, sun glasses (UVA+UVB protection) and wide brim hats (4-inch brim around the entire circumference of the hat). - Call for new or changing lesions.  Return for as scheduled TBSE .  IMarye Round, CMA, am acting as scribe for Sarina Ser, MD .  Documentation: I have reviewed the above documentation for accuracy and completeness, and I agree with the above.  Sarina Ser, MD

## 2021-12-02 NOTE — Patient Instructions (Addendum)

## 2021-12-04 ENCOUNTER — Encounter: Payer: Self-pay | Admitting: Dermatology

## 2022-03-28 ENCOUNTER — Ambulatory Visit (INDEPENDENT_AMBULATORY_CARE_PROVIDER_SITE_OTHER): Payer: 59 | Admitting: Dermatology

## 2022-03-28 ENCOUNTER — Encounter: Payer: Self-pay | Admitting: Dermatology

## 2022-03-28 DIAGNOSIS — Z86018 Personal history of other benign neoplasm: Secondary | ICD-10-CM

## 2022-03-28 DIAGNOSIS — Z85828 Personal history of other malignant neoplasm of skin: Secondary | ICD-10-CM

## 2022-03-28 DIAGNOSIS — Z1283 Encounter for screening for malignant neoplasm of skin: Secondary | ICD-10-CM | POA: Diagnosis not present

## 2022-03-28 DIAGNOSIS — D492 Neoplasm of unspecified behavior of bone, soft tissue, and skin: Secondary | ICD-10-CM

## 2022-03-28 DIAGNOSIS — C44519 Basal cell carcinoma of skin of other part of trunk: Secondary | ICD-10-CM | POA: Diagnosis not present

## 2022-03-28 DIAGNOSIS — D229 Melanocytic nevi, unspecified: Secondary | ICD-10-CM

## 2022-03-28 DIAGNOSIS — L578 Other skin changes due to chronic exposure to nonionizing radiation: Secondary | ICD-10-CM | POA: Diagnosis not present

## 2022-03-28 DIAGNOSIS — L820 Inflamed seborrheic keratosis: Secondary | ICD-10-CM

## 2022-03-28 DIAGNOSIS — L814 Other melanin hyperpigmentation: Secondary | ICD-10-CM

## 2022-03-28 DIAGNOSIS — L821 Other seborrheic keratosis: Secondary | ICD-10-CM

## 2022-03-28 DIAGNOSIS — D1800 Hemangioma unspecified site: Secondary | ICD-10-CM

## 2022-03-28 NOTE — Patient Instructions (Signed)
Wound Care Instructions  Cleanse wound gently with soap and water once a day then pat dry with clean gauze. Apply a thing coat of Petrolatum (petroleum jelly, "Vaseline") over the wound (unless you have an allergy to this). We recommend that you use a new, sterile tube of Vaseline. Do not pick or remove scabs. Do not remove the yellow or white "healing tissue" from the base of the wound.  Cover the wound with fresh, clean, nonstick gauze and secure with paper tape. You may use Band-Aids in place of gauze and tape if the would is small enough, but would recommend trimming much of the tape off as there is often too much. Sometimes Band-Aids can irritate the skin.  You should call the office for your biopsy report after 1 week if you have not already been contacted.  If you experience any problems, such as abnormal amounts of bleeding, swelling, significant bruising, significant pain, or evidence of infection, please call the office immediately.  FOR ADULT SURGERY PATIENTS: If you need something for pain relief you may take 1 extra strength Tylenol (acetaminophen) AND 2 Ibuprofen ('200mg'$  each) together every 4 hours as needed for pain. (do not take these if you are allergic to them or if you have a reason you should not take them.) Typically, you may only need pain medication for 1 to 3 days.      Cryotherapy Aftercare  Wash gently with soap and water everyday.   Apply Vaseline and Band-Aid daily until healed.   Prior to procedure, discussed risks of blister formation, small wound, skin dyspigmentation, or rare scar following cryotherapy. Recommend Vaseline ointment to treated areas while healing.    Melanoma ABCDEs  Melanoma is the most dangerous type of skin cancer, and is the leading cause of death from skin disease.  You are more likely to develop melanoma if you: Have light-colored skin, light-colored eyes, or red or blond hair Spend a lot of time in the sun Tan regularly, either  outdoors or in a tanning bed Have had blistering sunburns, especially during childhood Have a close family member who has had a melanoma Have atypical moles or large birthmarks  Early detection of melanoma is key since treatment is typically straightforward and cure rates are extremely high if we catch it early.   The first sign of melanoma is often a change in a mole or a new dark spot.  The ABCDE system is a way of remembering the signs of melanoma.  A for asymmetry:  The two halves do not match. B for border:  The edges of the growth are irregular. C for color:  A mixture of colors are present instead of an even brown color. D for diameter:  Melanomas are usually (but not always) greater than 74m - the size of a pencil eraser. E for evolution:  The spot keeps changing in size, shape, and color.  Please check your skin once per month between visits. You can use a small mirror in front and a large mirror behind you to keep an eye on the back side or your body.   If you see any new or changing lesions before your next follow-up, please call to schedule a visit.  Please continue daily skin protection including broad spectrum sunscreen SPF 30+ to sun-exposed areas, reapplying every 2 hours as needed when you're outdoors.   Staying in the shade or wearing long sleeves, sun glasses (UVA+UVB protection) and wide brim hats (4-inch brim around the entire circumference  of the hat) are also recommended for sun protection.     Due to recent changes in healthcare laws, you may see results of your pathology and/or laboratory studies on MyChart before the doctors have had a chance to review them. We understand that in some cases there may be results that are confusing or concerning to you. Please understand that not all results are received at the same time and often the doctors may need to interpret multiple results in order to provide you with the best plan of care or course of treatment. Therefore, we  ask that you please give [place] 2 business days to thoroughly review all your results before contacting the office for clarification. Should we see a critical lab result, you will be contacted sooner.   If You Need Anything After Your Visit  If you have any questions or concerns for your doctor, please call our main line at [phone] and press option 4 to reach your doctor's medical assistant. If no one answers, please leave a voicemail as directed and we will return your call as soon as possible. Messages left after 4 pm will be answered the following business day.   You may also send [place] a message via [place]. We typically respond to MyChart messages within 1-2 business days.  For prescription refills, please ask your pharmacy to contact our office. Our fax number is [phone].  If you have an urgent issue when the clinic is closed that cannot wait until the next business day, you can page your doctor at the number below.    Please note that while we do our best to be available for urgent issues outside of office hours, we are not available 24/7.   If you have an urgent issue and are unable to reach [place], you may choose to seek medical care at your doctor's office, retail clinic, urgent care center, or emergency room.  If you have a medical emergency, please immediately call 911 or go to the emergency department.  Pager Numbers  - Dr. Nehemiah Massed: [phone]  - Dr. Laurence Ferrari: [phone]  - Dr. Nicole Kindred: [phone]  In the event of inclement weather, please call our main line at [phone] for an update on the status of any delays or closures.  Dermatology Medication Tips: Please keep the boxes that topical medications come in in order to help keep track of the instructions about where and how to use these. Pharmacies typically print the medication instructions only on the boxes and not directly on the medication tubes.   If your medication is too expensive, please contact our office  at [phone] option 4 or send [place] a message through [place].   We are unable to tell what your co-pay for medications will be in advance as this is different depending on your insurance coverage. However, we may be able to find a substitute medication at lower cost or fill out paperwork to get insurance to cover a needed medication.   If a prior authorization is required to get your medication covered by your insurance company, please allow [place] 1-2 business days to complete this process.  Drug prices often vary depending on where the prescription is filled and some pharmacies may offer cheaper prices.  The website www.goodrx.com contains coupons for medications through different pharmacies. The prices here do not account for what the cost may be with help from insurance (it may be cheaper with your insurance), but the website can give you the price if you did not use  any insurance.  - You can print the associated coupon and take it with your prescription to the pharmacy.  - You may also stop by our office during regular business hours and pick up a GoodRx coupon card.  - If you need your prescription sent electronically to a different pharmacy, notify our office through [facility] or by phone at [phone] option 4.     Si Usted Necesita Algo Despus de Su Visita  Tambin puede enviarnos un mensaje a travs de Pharmacist, community. Por lo general respondemos a los mensajes de MyChart en el transcurso de 1 a 2 das hbiles.  Para renovar recetas, por favor pida a su farmacia que se ponga en contacto con nuestra oficina. Harland Dingwall de fax es [place] [phone].  Si tiene un asunto urgente cuando la clnica est cerrada y que no puede esperar hasta el siguiente da hbil, puede llamar/localizar a su doctor(a) al nmero que aparece a continuacin.   Por favor, tenga en cuenta que aunque hacemos todo lo posible para estar disponibles para asuntos urgentes fuera del horario de [place], no estamos  disponibles las 24 horas del da, los 7 das de la [place].   Si tiene un problema urgente y no puede comunicarse con nosotros, puede optar por buscar atencin mdica  en el consultorio de su doctor(a), en una clnica privada, en un centro de atencin urgente o en una sala de emergencias.  Si tiene Engineering geologist, por favor llame inmediatamente al 911 o vaya a la sala de emergencias.  Nmeros de bper  - Dr. Nehemiah Massed: [phone]  - Dra. Moye: [phone]  - Dra. Nicole Kindred: [phone]  En caso de inclemencias del [place], por favor llame a Johnsie Kindred principal al [phone] para una actualizacin sobre el [place] de cualquier retraso o cierre.  Consejos para la medicacin en dermatologa: Por favor, guarde las cajas en las que vienen los medicamentos de uso tpico para ayudarle a seguir las instrucciones sobre dnde y cmo usarlos. Las farmacias generalmente imprimen las instrucciones del medicamento slo en las cajas y no directamente en los tubos del [place].   Si su medicamento es muy caro, por favor, pngase en contacto con Zigmund Daniel llamando al [phone] y presione la opcin 4 o envenos un mensaje a travs de Pharmacist, community.   No podemos decirle cul ser su copago por los medicamentos por adelantado ya que esto es diferente dependiendo de la cobertura de su seguro. Sin embargo, es posible que podamos encontrar un medicamento sustituto a Electrical engineer un formulario para que el seguro cubra el medicamento que se considera necesario.   Si se requiere una autorizacin previa para que su compaa de seguros [place] su medicamento, por favor permtanos de 1 a 2 das hbiles para completar este proceso.  Los precios de los medicamentos varan con frecuencia dependiendo del Environmental consultant de dnde se surte la receta y alguna farmacias pueden ofrecer precios ms baratos.  El sitio web www.goodrx.com tiene cupones para medicamentos de Airline pilot. Los precios aqu no  tienen en cuenta lo que podra costar con la ayuda del seguro (puede ser ms barato con su seguro), pero el sitio web puede darle el precio si no utiliz Research scientist (physical sciences).  - Puede imprimir el cupn correspondiente y llevarlo con su receta a la farmacia.  - Tambin puede pasar por nuestra oficina durante el horario de atencin regular y Charity fundraiser una tarjeta de cupones de GoodRx.  - Si necesita que su receta se enve electrnicamente a  una farmacia diferente, informe a nuestra oficina a travs de MyChart de [place] o por telfono llamando al [phone] y presione la opcin 4.

## 2022-03-28 NOTE — Progress Notes (Signed)
Follow-Up Visit   Subjective  Selena Manning is a 56 y.o. female who presents for the following: Annual Exam (Skin cancer screening. Full body. Hx of BCC's. Hx of dysplastic nevi, Hx of Aks. Area on nose was scaly). The patient presents for Total-Body Skin Exam (TBSE) for skin cancer screening and mole check.  The patient has spots, moles and lesions to be evaluated, some may be new or changing and the patient has concerns that these could be cancer.  The following portions of the chart were reviewed this encounter and updated as appropriate:  Tobacco  Allergies  Meds  Problems  Med Hx  Surg Hx  Fam Hx     Review of Systems: No other skin or systemic complaints except as noted in HPI or Assessment and Plan.  Objective  Well appearing patient in no apparent distress; mood and affect are within normal limits.  A full examination was performed including scalp, head, eyes, ears, nose, lips, neck, chest, axillae, abdomen, back, buttocks, bilateral upper extremities, bilateral lower extremities, hands, feet, fingers, toes, fingernails, and toenails. All findings within normal limits unless otherwise noted below.  Right inferior medial scapular area 1.0 cm pink scaly patch     left medial inframammary x2 (2) Erythematous keratotic or waxy stuck-on papule or plaque.   Assessment & Plan   History of Basal Cell Carcinoma of the Skin. Multiple sites, see history. - No evidence of recurrence today - Recommend regular full body skin exams - Recommend daily broad spectrum sunscreen SPF 30+ to sun-exposed areas, reapply every 2 hours as needed.  - Call if any new or changing lesions are noted between office visits   History of Dysplastic Nevi. Multiple sites, see history. - No evidence of recurrence today - Recommend regular full body skin exams - Recommend daily broad spectrum sunscreen SPF 30+ to sun-exposed areas, reapply every 2 hours as needed.  - Call if any new or changing  lesions are noted between office visits   Lentigines - Scattered tan macules - Due to sun exposure - Benign-appearing, observe - Recommend daily broad spectrum sunscreen SPF 30+ to sun-exposed areas, reapply every 2 hours as needed. - Call for any changes  Seborrheic Keratoses - Stuck-on, waxy, tan-brown papules and/or plaques  - Benign-appearing - Discussed benign etiology and prognosis. - Observe - Call for any changes  Melanocytic Nevi - Tan-brown and/or pink-flesh-colored symmetric macules and papules - Benign appearing on exam today - Observation - Call clinic for new or changing moles - Recommend daily use of broad spectrum spf 30+ sunscreen to sun-exposed areas.   Hemangiomas - Red papules - Discussed benign nature - Observe - Call for any changes  Actinic Damage - Chronic condition, secondary to cumulative UV/sun exposure - diffuse scaly erythematous macules with underlying dyspigmentation - Recommend daily broad spectrum sunscreen SPF 30+ to sun-exposed areas, reapply every 2 hours as needed.  - Staying in the shade or wearing long sleeves, sun glasses (UVA+UVB protection) and wide brim hats (4-inch brim around the entire circumference of the hat) are also recommended for sun protection.  - Call for new or changing lesions.  Skin cancer screening performed today.  Neoplasm of skin Right inferior medial scapular area Epidermal / dermal shaving  Lesion diameter (cm):  1 Informed consent: discussed and consent obtained   Timeout: patient name, date of birth, surgical site, and procedure verified   Procedure prep:  Patient was prepped and draped in usual sterile fashion Prep type:  Isopropyl  alcohol Anesthesia: the lesion was anesthetized in a standard fashion   Anesthetic:  1% lidocaine w/ epinephrine 1-100,000 buffered w/ 8.4% NaHCO3 Instrument used: flexible razor blade   Hemostasis achieved with: pressure, aluminum chloride and electrodesiccation   Outcome:  patient tolerated procedure well   Post-procedure details: sterile dressing applied and wound care instructions given   Dressing type: bandage and petrolatum    Destruction of lesion Complexity: extensive   Destruction method: electrodesiccation and curettage   Informed consent: discussed and consent obtained   Timeout:  patient name, date of birth, surgical site, and procedure verified Procedure prep:  Patient was prepped and draped in usual sterile fashion Prep type:  Isopropyl alcohol Anesthesia: the lesion was anesthetized in a standard fashion   Anesthetic:  1% lidocaine w/ epinephrine 1-100,000 buffered w/ 8.4% NaHCO3 Curettage performed in three different directions: Yes   Electrodesiccation performed over the curetted area: Yes   Curettage cycles:  3 Lesion length (cm):  1 Lesion width (cm):  1 Margin per side (cm):  0.2 Final wound size (cm):  1.4 Hemostasis achieved with:  pressure and aluminum chloride Outcome: patient tolerated procedure well with no complications   Post-procedure details: sterile dressing applied and wound care instructions given   Dressing type: bandage and petrolatum    Specimen 1 - Surgical pathology Differential Diagnosis: R/O BCC Check Margins: No  Inflamed seborrheic keratosis (2) left medial inframammary x2 Symptomatic, irritating, patient would like treated.  Destruction of lesion - left medial inframammary x2 Complexity: simple   Destruction method: cryotherapy   Informed consent: discussed and consent obtained   Timeout:  patient name, date of birth, surgical site, and procedure verified Lesion destroyed using liquid nitrogen: Yes   Region frozen until ice ball extended beyond lesion: Yes   Outcome: patient tolerated procedure well with no complications   Post-procedure details: wound care instructions given   Additional details:  Prior to procedure, discussed risks of blister formation, small wound, skin dyspigmentation, or rare scar  following cryotherapy. Recommend Vaseline ointment to treated areas while healing.  Skin cancer screening  Return in about 6 months (around 09/27/2022) for TBSE, Hx of multiple BCC's.  I, Emelia Salisbury, CMA, am acting as scribe for Sarina Ser, MD. Documentation: I have reviewed the above documentation for accuracy and completeness, and I agree with the above.  Sarina Ser, MD

## 2022-03-30 ENCOUNTER — Telehealth: Payer: Self-pay

## 2022-03-30 NOTE — Telephone Encounter (Signed)
Discussed biopsy results with pt  °

## 2022-03-30 NOTE — Telephone Encounter (Signed)
-----   Message from Ralene Bathe, MD sent at 03/29/2022  4:13 PM EDT ----- Diagnosis Skin , right inferior medial scapular area BASAL CELL CARCINOMA, SUPERFICIAL AND NODULAR PATTERNS, CLOSE TO MARGIN  Cancer - BCC Superficial Already treated Recheck next visit

## 2022-04-26 IMAGING — CR DG ELBOW COMPLETE 3+V*R*
4 series · 4 of 4 positions shown · non-contrast
Comparison: None.

CLINICAL DATA: Post fall with right elbow pain.

EXAM:
RIGHT ELBOW - COMPLETE 3+ VIEW

[x elbow joint ap right]
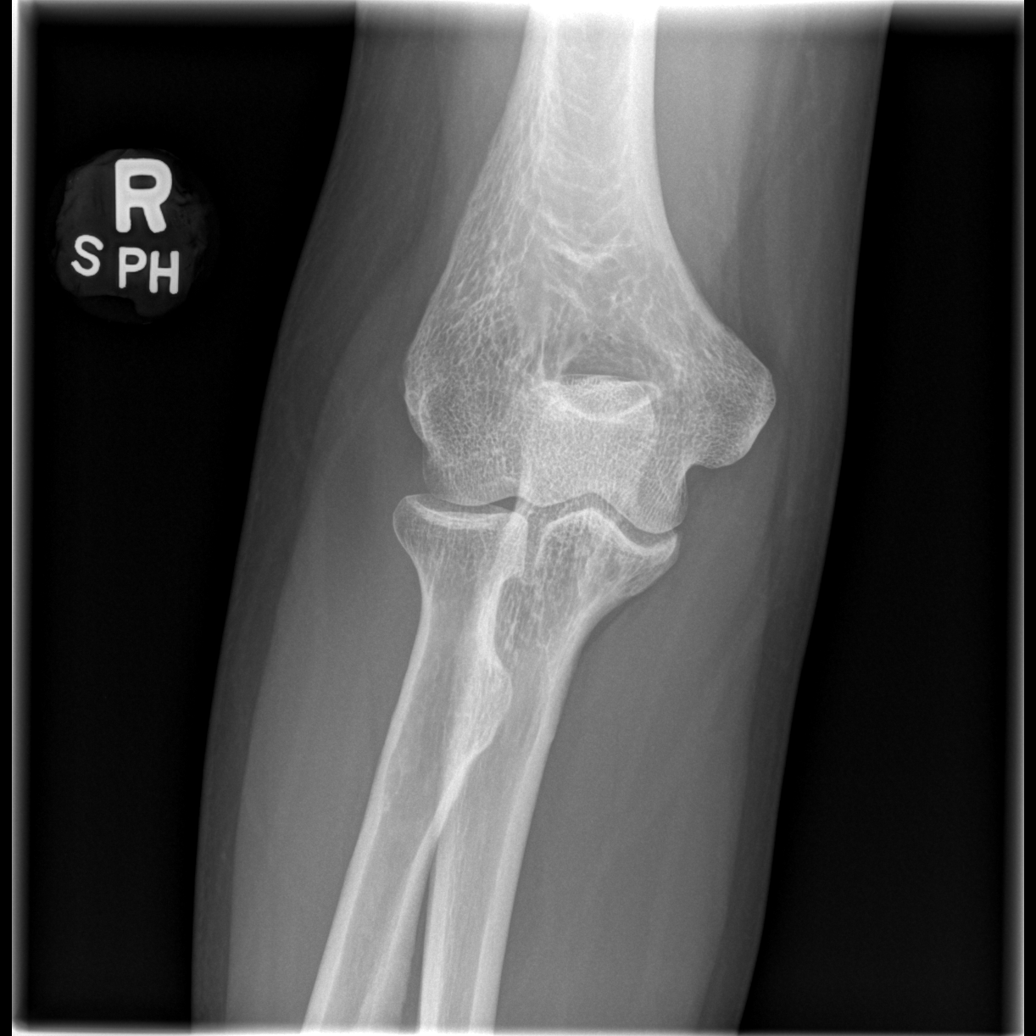

[x elbow joint obl. right (1 of 2)]
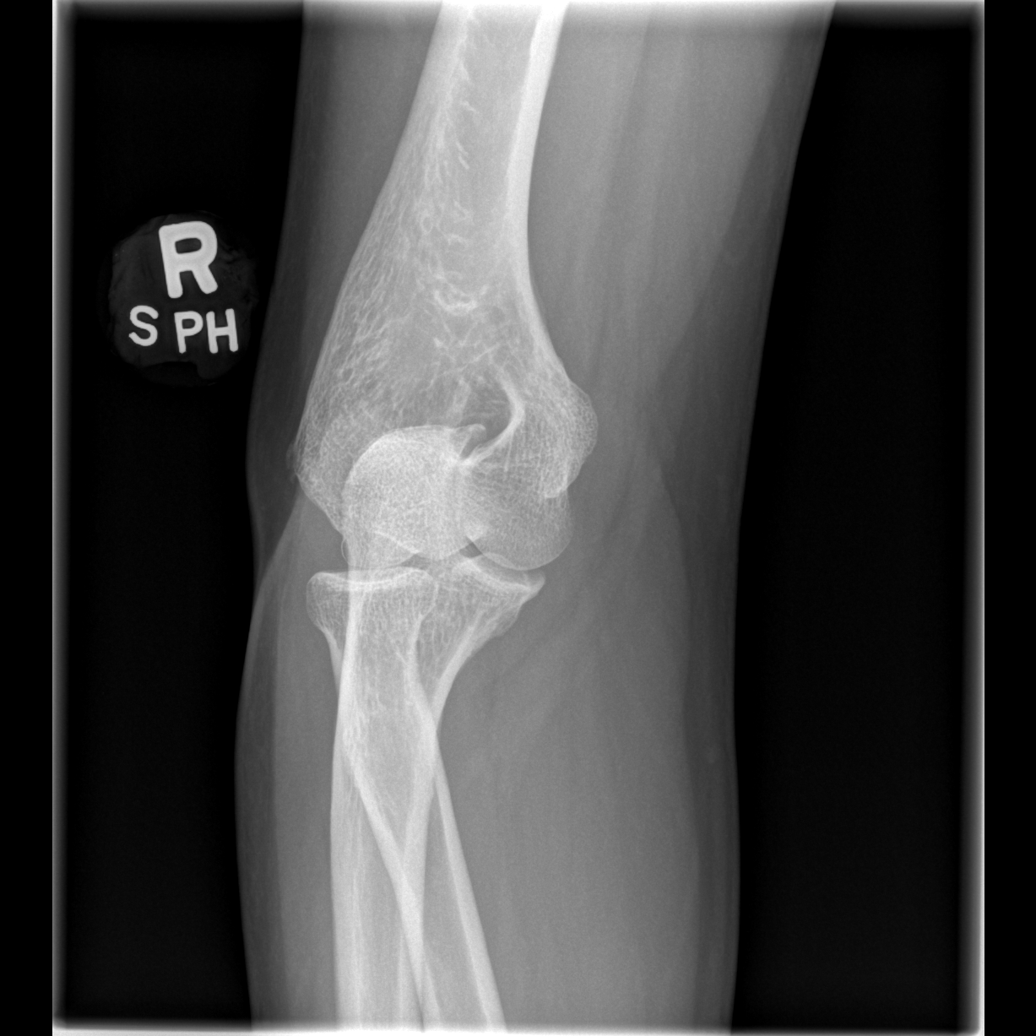

[x elbow joint obl. right (2 of 2)]
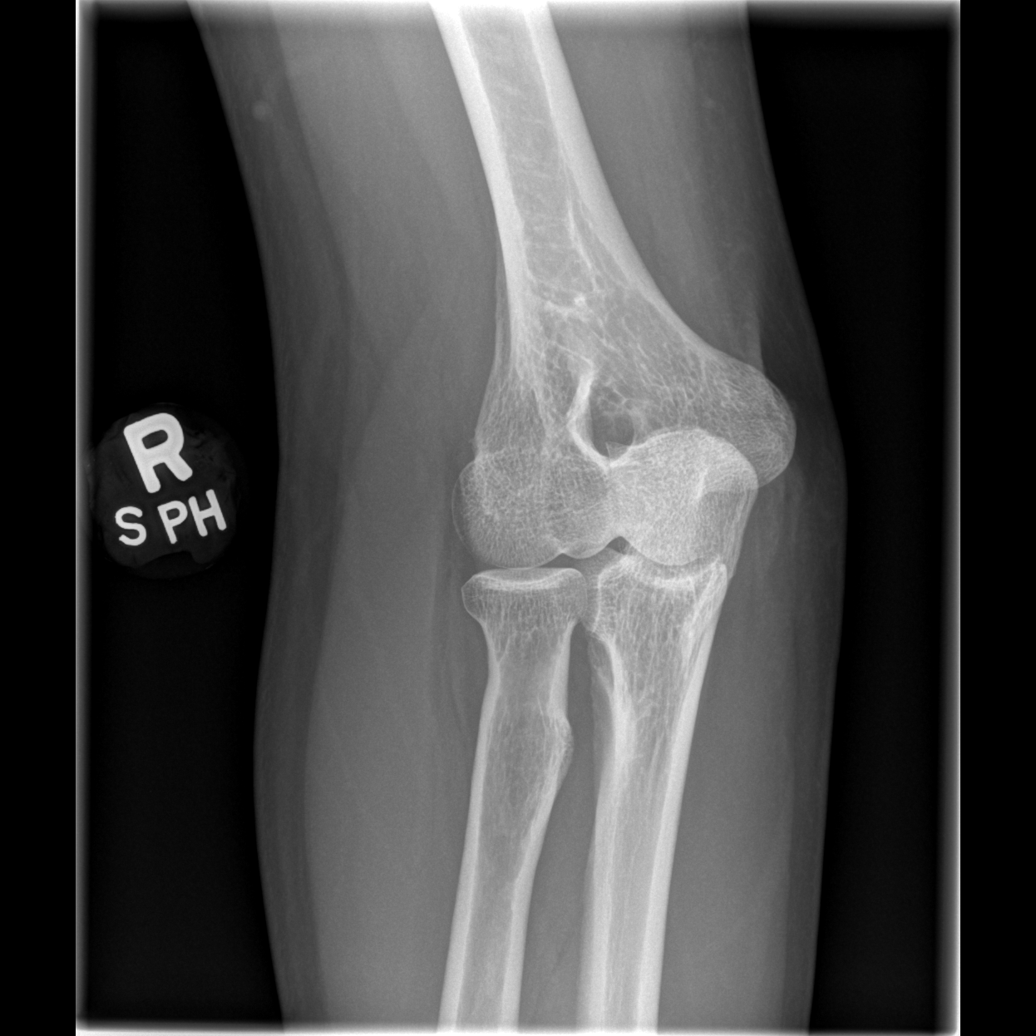

[x elbow joint lat right]
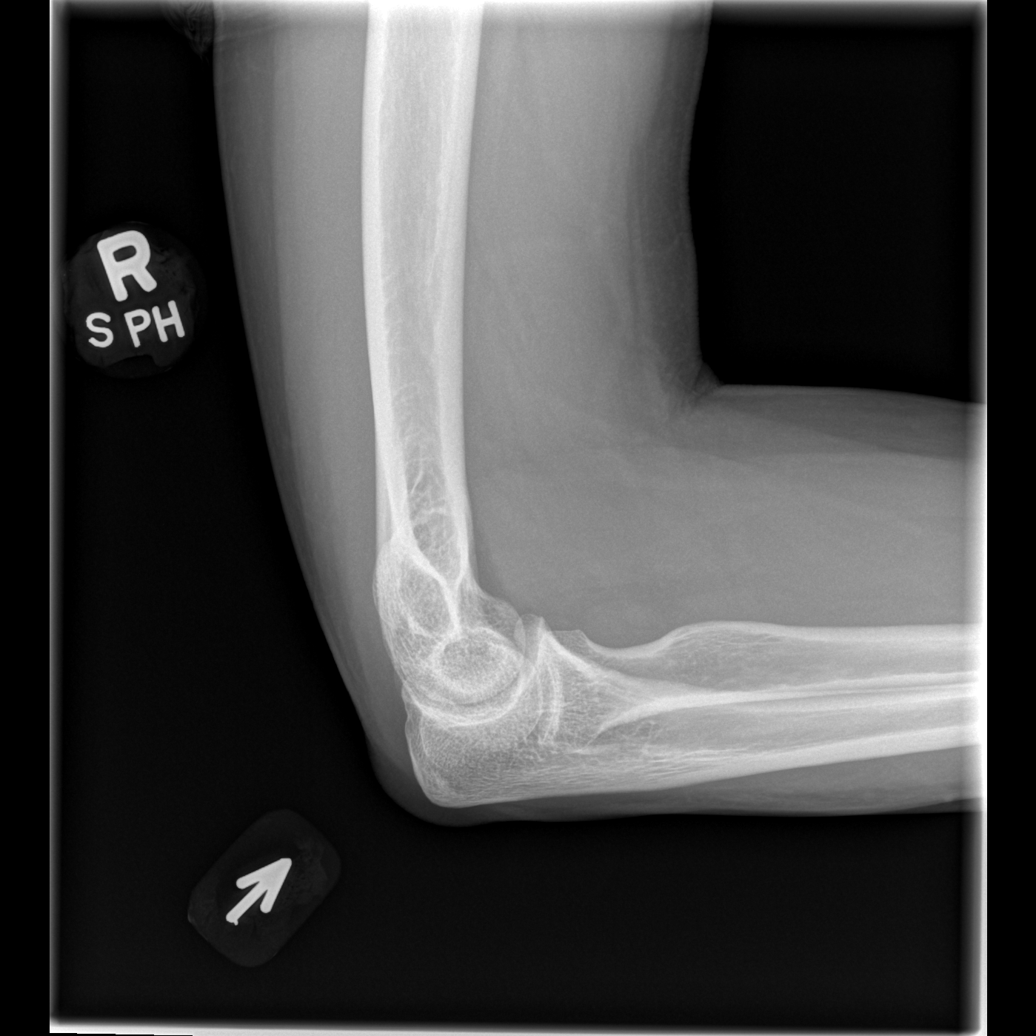

[4 of 4 positions shown; findings below may reference images not displayed]

FINDINGS: There is no evidence of fracture, dislocation, or joint effusion.
There is no evidence of arthropathy or other focal bone abnormality.
Soft tissues are unremarkable.
IMPRESSION: Negative radiographs of the right elbow.

## 2022-09-28 ENCOUNTER — Ambulatory Visit: Payer: 59 | Admitting: Dermatology

## 2022-12-05 ENCOUNTER — Ambulatory Visit: Payer: 59 | Admitting: Dermatology

## 2023-01-26 ENCOUNTER — Ambulatory Visit: Payer: 59 | Admitting: Dermatology

## 2023-05-11 ENCOUNTER — Ambulatory Visit: Payer: 59 | Admitting: Dermatology

## 2023-06-07 ENCOUNTER — Ambulatory Visit: Payer: 59 | Admitting: Dermatology

## 2023-11-02 ENCOUNTER — Ambulatory Visit: Payer: 59 | Admitting: Dermatology

## 2023-11-22 ENCOUNTER — Ambulatory Visit: Payer: 59 | Admitting: Dermatology

## 2023-11-22 ENCOUNTER — Encounter: Payer: Self-pay | Admitting: Dermatology

## 2023-11-22 DIAGNOSIS — W908XXA Exposure to other nonionizing radiation, initial encounter: Secondary | ICD-10-CM

## 2023-11-22 DIAGNOSIS — Z85828 Personal history of other malignant neoplasm of skin: Secondary | ICD-10-CM

## 2023-11-22 DIAGNOSIS — L578 Other skin changes due to chronic exposure to nonionizing radiation: Secondary | ICD-10-CM

## 2023-11-22 DIAGNOSIS — L570 Actinic keratosis: Secondary | ICD-10-CM | POA: Diagnosis not present

## 2023-11-22 DIAGNOSIS — D229 Melanocytic nevi, unspecified: Secondary | ICD-10-CM

## 2023-11-22 DIAGNOSIS — L814 Other melanin hyperpigmentation: Secondary | ICD-10-CM

## 2023-11-22 DIAGNOSIS — L821 Other seborrheic keratosis: Secondary | ICD-10-CM

## 2023-11-22 DIAGNOSIS — Z1283 Encounter for screening for malignant neoplasm of skin: Secondary | ICD-10-CM | POA: Diagnosis not present

## 2023-11-22 DIAGNOSIS — I8393 Asymptomatic varicose veins of bilateral lower extremities: Secondary | ICD-10-CM

## 2023-11-22 DIAGNOSIS — Z86018 Personal history of other benign neoplasm: Secondary | ICD-10-CM

## 2023-11-22 DIAGNOSIS — I781 Nevus, non-neoplastic: Secondary | ICD-10-CM

## 2023-11-22 DIAGNOSIS — D1801 Hemangioma of skin and subcutaneous tissue: Secondary | ICD-10-CM

## 2023-11-22 DIAGNOSIS — L918 Other hypertrophic disorders of the skin: Secondary | ICD-10-CM

## 2023-11-22 NOTE — Progress Notes (Signed)
 Follow-Up Visit   Subjective  Selena Manning is a 58 y.o. female who presents for the following: Skin Cancer Screening and Full Body Skin Exam hx of BCCs, Dysplastic Nevi, Aks, check spots, R ankle 21m, no symptoms, R face 38m, no symptoms  The patient presents for Total-Body Skin Exam (TBSE) for skin cancer screening and mole check. The patient has spots, moles and lesions to be evaluated, some may be new or changing and the patient may have concern these could be cancer.  Exam of nails limited by presence of nail polish.   The following portions of the chart were reviewed this encounter and updated as appropriate: medications, allergies, medical history  Review of Systems:  No other skin or systemic complaints except as noted in HPI or Assessment and Plan.  Objective  Well appearing patient in no apparent distress; mood and affect are within normal limits.  A full examination was performed including scalp, head, eyes, ears, nose, lips, neck, chest, axillae, abdomen, back, buttocks, bilateral upper extremities, bilateral lower extremities, hands, feet, fingers, toes, fingernails, and toenails. All findings within normal limits unless otherwise noted below.   Relevant physical exam findings are noted in the Assessment and Plan.  R lat cheek x 1, R med ankle x 1, (2) Pink scaly macules  Assessment & Plan   SKIN CANCER SCREENING PERFORMED TODAY.  ACTINIC DAMAGE chest - Chronic condition, secondary to cumulative UV/sun exposure - diffuse scaly erythematous macules with underlying dyspigmentation - Recommend daily broad spectrum sunscreen SPF 30+ to sun-exposed areas, reapply every 2 hours as needed.  - Staying in the shade or wearing long sleeves, sun glasses (UVA+UVB protection) and wide brim hats (4-inch brim around the entire circumference of the hat) are also recommended for sun protection.  - Call for new or changing lesions.  LENTIGINES, SEBORRHEIC KERATOSES, HEMANGIOMAS -  Benign normal skin lesions - Benign-appearing - Call for any changes  MELANOCYTIC NEVI - Tan-brown and/or pink-flesh-colored symmetric macules and papules - Benign appearing on exam today - Observation - Call clinic for new or changing moles - Recommend daily use of broad spectrum spf 30+ sunscreen to sun-exposed areas.   HISTORY OF BASAL CELL CARCINOMA OF THE SKIN- Multiple - No evidence of recurrence today - Recommend regular full body skin exams - Recommend daily broad spectrum sunscreen SPF 30+ to sun-exposed areas, reapply every 2 hours as needed.  - Call if any new or changing lesions are noted between office visits    HISTORY OF DYSPLASTIC NEVUS- Multiple No evidence of recurrence today Recommend regular full body skin exams Recommend daily broad spectrum sunscreen SPF 30+ to sun-exposed areas, reapply every 2 hours as needed.  Call if any new or changing lesions are noted between office visits    Acrochordons (Skin Tags) axilla - Fleshy, skin-colored pedunculated papules - Benign appearing.  - Observe. - If desired, they can be removed with an in office procedure that is not covered by insurance. - Please call the clinic if you notice any new or changing lesions.   Varicose Veins/Spider Veins legs - Dilated blue, purple or red veins at the lower extremities - Reassured - Smaller vessels can be treated by sclerotherapy (a procedure to inject a medicine into the veins to make them disappear) if desired, but the treatment is not covered by insurance. Larger vessels may be covered if symptomatic and we would refer to vascular surgeon if treatment desired.  AK (ACTINIC KERATOSIS) (2) R lat cheek x 1, R  med ankle x 1, (2) R medial ankle AK vs ISK   Actinic keratoses are precancerous spots that appear secondary to cumulative UV radiation exposure/sun exposure over time. They are chronic with expected duration over 1 year. A portion of actinic keratoses will progress to  squamous cell carcinoma of the skin. It is not possible to reliably predict which spots will progress to skin cancer and so treatment is recommended to prevent development of skin cancer.  Recommend daily broad spectrum sunscreen SPF 30+ to sun-exposed areas, reapply every 2 hours as needed.  Recommend staying in the shade or wearing long sleeves, sun glasses (UVA+UVB protection) and wide brim hats (4-inch brim around the entire circumference of the hat). Call for new or changing lesions. Destruction of lesion - R lat cheek x 1, R med ankle x 1, (2)  Destruction method: cryotherapy   Informed consent: discussed and consent obtained   Lesion destroyed using liquid nitrogen: Yes   Region frozen until ice ball extended beyond lesion: Yes   Outcome: patient tolerated procedure well with no complications   Post-procedure details: wound care instructions given   Additional details:  Prior to procedure, discussed risks of blister formation, small wound, skin dyspigmentation, or rare scar following cryotherapy. Recommend Vaseline ointment to treated areas while healing.  Return in about 1 year (around 11/21/2024) for TBSE, Hx of BCC, Hx of Dysplastic nevi, Hx of AKs.  I, Grayce Saunas, RMA, am acting as scribe for Rexene Rattler, MD .   Documentation: I have reviewed the above documentation for accuracy and completeness, and I agree with the above.  Rexene Rattler, MD

## 2023-11-22 NOTE — Patient Instructions (Addendum)

## 2024-11-02 ENCOUNTER — Ambulatory Visit (HOSPITAL_COMMUNITY)
Admission: RE | Admit: 2024-11-02 | Discharge: 2024-11-02 | Disposition: A | Discharge status: Home / Self Care | Source: Ambulatory Visit | Attending: Emergency Medicine

## 2024-11-02 ENCOUNTER — Ambulatory Visit (HOSPITAL_COMMUNITY)

## 2024-11-02 ENCOUNTER — Encounter (HOSPITAL_COMMUNITY): Payer: Self-pay

## 2024-11-02 VITALS — BP 128/87 | HR 97 | Temp 98.2°F | Resp 18

## 2024-11-02 DIAGNOSIS — J40 Bronchitis, not specified as acute or chronic: Secondary | ICD-10-CM | POA: Diagnosis not present

## 2024-11-02 DIAGNOSIS — R059 Cough, unspecified: Secondary | ICD-10-CM

## 2024-11-02 MED ORDER — PROMETHAZINE-DM 6.25-15 MG/5ML PO SYRP: 5.0000 mL | ORAL_SOLUTION | Freq: Four times a day (QID) | ORAL | 0 refills | Status: AC | PRN

## 2024-11-02 MED ORDER — PREDNISONE 20 MG PO TABS: 40.0000 mg | ORAL_TABLET | Freq: Every day | ORAL | 0 refills | Status: AC

## 2024-11-02 NOTE — ED Triage Notes (Signed)
 Pt states she has been sick X 3 weeks but the cough has not got better. She is taking OTC cough meds without relief.

## 2024-11-02 NOTE — Discharge Instructions (Signed)
 Take the prednisone  daily with breakfast starting tomorrow.  You can use the cough syrup up to 4 times daily, this may cause drowsiness.  It should help to loosen secretions.  Ensure you are drinking at least 64 ounces of water daily as well.  Symptoms should improve with medications.  If no improvement or any changes seek follow-up care.

## 2024-11-02 NOTE — ED Provider Notes (Signed)
 " [facility]    CSN: [id number] Arrival date & time: 11/02/24  1713      History   Chief Complaint Chief Complaint  Patient presents with   Cough    I have had an ongoing cough for ~2 1/2 weeks.  OTC medication has not working to eliminate the coughing. - Entered by patient    HPI Selena Manning is a 59 y.o. female.   Patient presents to clinic over concern of ongoing cough that has been present for about 3 weeks now.  Was initially sick with high fever, headache, dizziness, sore throat, rhinorrhea and congestion, flulike illness.  This lasted for about a week and since then all symptoms have improved except for cough and rhinorrhea.  Has been taking Robitussin, Claritin and NyQuil with some improvement. Denies wheezing, shortness of breath or fevers.  Has not been fatigued. Does not have a history of asthma and does not smoke   The history is provided by the patient and medical records.  Cough   Past Medical History:  Diagnosis Date   Actinic keratosis 06/11/2013   Right glabella   AK (actinic keratosis) 10/25/2021   Left Breast, scheduled for LN2   Basal cell carcinoma 05/07/2008   Right lateral neck. Superficial   Basal cell carcinoma 05/07/2008   Right medial top shoulder. Superficial   Basal cell carcinoma 08/14/2008   L sup. medial breast.   Basal cell carcinoma 02/16/2009   right medial inferior breast. Superficial   Basal cell carcinoma 07/26/2010   Left posterior shoulder. Superficial. ED[facility]   Basal cell carcinoma 04/08/2013   Right distal anterior lateral thigh. Superficial.   Basal cell carcinoma 03/28/2022   right inferior medical scapular area- sup-nodular pattern- [facility]   BCC (basal cell carcinoma of skin) 10/25/2021   left mid to lower back paraspinal, [facility]   Dysplastic nevi 06/25/2008   ant. sup. neck, moderate; L abdomen suprapubic, moderate; R pubic, mod-marked atypia   Dysplastic nevi 04/08/2013   Right lat. lower back, moderate  atypia; suprapubic area, moderate atypia   Dysplastic nevus 05/07/2008   Left superior medial scapula. Moderate atypia.   Dysplastic nevus 02/16/2009   Right low back. Slight to moderate atypia    There are no active problems to display for this patient.   Past Surgical History:  Procedure Laterality Date   CESAREAN SECTION     NOSE SURGERY     TUBAL LIGATION      OB History   No obstetric history on file.      Home Medications    Prior to Admission medications  Medication Sig Start Date End Date Taking? Authorizing Provider  predniSONE  (DELTASONE ) 20 MG tablet Take 2 tablets (40 mg total) by mouth daily for 5 days. 11/02/24 11/07/24 Yes Ball, Talyssa Gibas  G, FNP  promethazine -dextromethorphan (PROMETHAZINE -DM) 6.25-15 MG/5ML syrup Take 5 mLs by mouth 4 (four) times daily as needed for cough. 11/02/24  Yes Ball, Roverto Bodmer  G, FNP  COMBIPATCH 0.05-0.14 MG/DAY 1 patch 2 (two) times a week. 02/10/22   [provider]    Family History Family History  Problem Relation Age of Onset   Breast cancer Mother    Breast cancer Maternal Grandmother     Social History Social History[1]   Allergies   Patient has no known allergies.   Review of Systems Review of Systems  Per HPI  Physical Exam Triage Vital Signs ED Triage Vitals [11/02/24 1744]  Encounter Vitals Group     BP 128/87  Girls Systolic BP Percentile      Girls Diastolic BP Percentile      Boys Systolic BP Percentile      Boys Diastolic BP Percentile      Pulse Rate 97     Resp 18     Temp 98.2 F (36.8 C)     Temp Source Oral     SpO2 95 %     Weight      Height      Head Circumference      Peak Flow      Pain Score 0     Pain Loc      Pain Education      Exclude from Growth Chart    No data found.  Updated Vital Signs BP 128/87 (BP Location: Left Arm)   Pulse 97   Temp 98.2 F (36.8 C) (Oral)   Resp 18   SpO2 95%   Visual Acuity Right Eye Distance:   Left Eye Distance:   Bilateral  Distance:    Right Eye Near:   Left Eye Near:    Bilateral Near:     Physical Exam Vitals and nursing note reviewed.  Constitutional:      Appearance: Normal appearance.  HENT:     Head: Normocephalic and atraumatic.     Right Ear: External ear normal.     Left Ear: External ear normal.     Nose: Congestion present.     Mouth/Throat:     Mouth: Mucous membranes are moist.  Eyes:     Conjunctiva/sclera: Conjunctivae normal.  Cardiovascular:     Rate and Rhythm: Normal rate and regular rhythm.     Heart sounds: Normal heart sounds. No murmur heard. Pulmonary:     Effort: Pulmonary effort is normal. No respiratory distress.     Breath sounds: Normal breath sounds. No wheezing.  Skin:    General: Skin is warm.  Neurological:     General: No focal deficit present.     Mental Status: She is alert.  Psychiatric:        Mood and Affect: Mood normal.      UC Treatments / Results  Labs (all labs ordered are listed, but only abnormal results are displayed) Labs Reviewed - No data to display  EKG   Radiology No results found.  Procedures Procedures (including critical care time)  Medications Ordered in UC Medications - No data to display  Initial Impression / Assessment and Plan / UC Course  I have reviewed the triage vital signs and the nursing notes.  Pertinent labs & imaging results that were available during my care of the patient were reviewed by me and considered in my medical decision making (see chart for details).  Vitals and triage reviewed, patient is hemodynamically stable.  Lungs vesicular, heart with regular rate and rhythm.  Ongoing cough for the past 3 weeks with congestion, symptoms consistent with acute colitis.  Will treat with prednisone  and cough management.  Lower clinical concern for pneumonia, afebrile and without fatigue.  Without wheezing or shortness of breath.  Symptomatic management for bronchitis discussed.  Plan of care, follow-up care,  and return precautions given, no questions at this time.    Final Clinical Impressions(s) / UC Diagnoses   Final diagnoses:  Cough, unspecified type  Bronchitis     Discharge Instructions      Take the prednisone  daily with breakfast starting tomorrow.  You can use the cough syrup up to 4 times  daily, this may cause drowsiness.  It should help to loosen secretions.  Ensure you are drinking at least 64 ounces of water daily as well.  Symptoms should improve with medications.  If no improvement or any changes seek follow-up care.      ED Prescriptions     Medication Sig Dispense Auth. Provider   promethazine -dextromethorphan (PROMETHAZINE -DM) 6.25-15 MG/5ML syrup Take 5 mLs by mouth 4 (four) times daily as needed for cough. 118 mL Ball, Klee Kolek  G, FNP   predniSONE  (DELTASONE ) 20 MG tablet Take 2 tablets (40 mg total) by mouth daily for 5 days. 10 tablet Ball, Jaze Rodino  G, FNP      PDMP not reviewed this encounter.     [1]  Social History Tobacco Use   Smoking status: Never   Smokeless tobacco: Never  Vaping Use   Vaping status: Never Used  Substance Use Topics   Alcohol use: Yes   Drug use: Never     Mercer, Deanne Bedgood  G, FNP 11/02/24 1846  "

## 2024-11-26 ENCOUNTER — Encounter: Payer: 59 | Admitting: Dermatology
